# Patient Record
Sex: Male | Born: 1955 | Race: White | Hispanic: No | Marital: Single | Smoking: Never smoker
Health system: Southern US, Community
[De-identification: ages and names within clinical notes are randomized; demographics above are authoritative.]

## PROBLEM LIST (undated history)

## (undated) DIAGNOSIS — IMO0001 Reserved for inherently not codable concepts without codable children: Secondary | ICD-10-CM

## (undated) DIAGNOSIS — E785 Hyperlipidemia, unspecified: Secondary | ICD-10-CM

## (undated) DIAGNOSIS — I10 Essential (primary) hypertension: Secondary | ICD-10-CM

## (undated) DIAGNOSIS — K219 Gastro-esophageal reflux disease without esophagitis: Secondary | ICD-10-CM

## (undated) HISTORY — DX: Hyperlipidemia, unspecified: E78.5

## (undated) HISTORY — DX: Reserved for inherently not codable concepts without codable children: IMO0001

## (undated) HISTORY — PX: NASAL SINUS SURGERY: SHX719

## (undated) HISTORY — DX: Essential (primary) hypertension: I10

## (undated) HISTORY — DX: Gastro-esophageal reflux disease without esophagitis: K21.9

---

## 2002-03-01 ENCOUNTER — Emergency Department (HOSPITAL_COMMUNITY): Admission: EM | Admit: 2002-03-01 | Discharge: 2002-03-01 | Payer: Self-pay | Admitting: Emergency Medicine

## 2002-03-01 ENCOUNTER — Encounter: Payer: Self-pay | Admitting: Emergency Medicine

## 2003-12-14 ENCOUNTER — Ambulatory Visit (HOSPITAL_COMMUNITY): Admission: RE | Admit: 2003-12-14 | Discharge: 2003-12-14 | Payer: Self-pay | Admitting: Family Medicine

## 2004-01-27 ENCOUNTER — Ambulatory Visit (HOSPITAL_COMMUNITY): Admission: RE | Admit: 2004-01-27 | Discharge: 2004-01-27 | Payer: Self-pay | Admitting: Otolaryngology

## 2004-01-27 ENCOUNTER — Ambulatory Visit (HOSPITAL_BASED_OUTPATIENT_CLINIC_OR_DEPARTMENT_OTHER): Admission: RE | Admit: 2004-01-27 | Discharge: 2004-01-27 | Payer: Self-pay | Admitting: Otolaryngology

## 2013-03-30 ENCOUNTER — Ambulatory Visit (INDEPENDENT_AMBULATORY_CARE_PROVIDER_SITE_OTHER): Payer: PRIVATE HEALTH INSURANCE

## 2013-03-30 ENCOUNTER — Encounter: Payer: Self-pay | Admitting: Podiatry

## 2013-03-30 ENCOUNTER — Telehealth: Payer: Self-pay | Admitting: *Deleted

## 2013-03-30 ENCOUNTER — Ambulatory Visit (INDEPENDENT_AMBULATORY_CARE_PROVIDER_SITE_OTHER): Payer: PRIVATE HEALTH INSURANCE | Admitting: Podiatry

## 2013-03-30 VITALS — Ht 76.0 in | Wt 260.0 lb

## 2013-03-30 DIAGNOSIS — Q828 Other specified congenital malformations of skin: Secondary | ICD-10-CM

## 2013-03-30 DIAGNOSIS — M7731 Calcaneal spur, right foot: Secondary | ICD-10-CM

## 2013-03-30 DIAGNOSIS — M79671 Pain in right foot: Secondary | ICD-10-CM

## 2013-03-30 DIAGNOSIS — M79609 Pain in unspecified limb: Secondary | ICD-10-CM

## 2013-03-30 DIAGNOSIS — M7730 Calcaneal spur, unspecified foot: Secondary | ICD-10-CM

## 2013-03-30 DIAGNOSIS — M7660 Achilles tendinitis, unspecified leg: Secondary | ICD-10-CM

## 2013-03-30 MED ORDER — METHYLPREDNISOLONE (PAK) 4 MG PO TABS: ORAL_TABLET | ORAL | Status: DC

## 2013-03-30 NOTE — Progress Notes (Signed)
Mr. Letitia Libra presents today as a 57 year old white male with a chief complaint of pain to his right heel. He states that the pain is been present for several months and the bump is been present for as long as he can remember. He states this started hurting back in March of this year and gradually got worse walking bothers him and particularly going downstairs. 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 He tried ice and ibuprofen. He also has a painful callus to the plantar aspect of the fifth metatarsal left foot he had one similarly to the right foot but has taken care of it himself. He states he is unable to get rid of the one to the left foot. States that the pain in the right heel is more of a throbbing pain with sharp shooting pain.  Objective: Vital signs are stable he is alert and oriented x3. I have reviewed his past medical history is medications and allergies and his review of systems. Pulses are bilateral lower extremity are intact. Neurologic sensorium is intact per Semmes-Weinstein monofilament. Deep tendon reflexes are brisk and symmetrical bilateral. Muscle strength is 5 over 5 dorsiflexors plantar flexors inverters everters all intrinsic musculature is intact orthopedic evaluation demonstrates a large nonpulsatile mass to the posterior aspect of the right heel similarly to the left heel but not tender. Cutaneous evaluation does demonstrates supple well hydrated cutis with exception of her reactive porokeratotic lesion sub-fifth metatarsal head of the left foot which we debrided today. Graphic evaluation does demonstrate a very large mass of bone to the posterior aspect of the calcaneus indicative of a retrocalcaneal heel spur he also has thickening of the tendo Achilles which is indicative of tendo Achilles tendinitis and possibly a tear because of the thickness that projects proximally. Not being able to walk down steps oftentimes is indicative of a  tear.  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 Porokeratosis sub-fifth metatarsal head left foot.  Plan: We discussed the etiology pathology conservative versus surgical therapies for his right foot. At this point I do believe an MRI is necessary to evaluate the structural integrity of the tendo Achilles and this is a surgical consideration. I debrided the reactive hyperkeratosis sub-fifth metatarsal head left foot. I wrote a prescription for Medrol Dosepak which she will start in the near future nor followup with him once his MRI has returned.

## 2013-03-30 NOTE — Progress Notes (Signed)
  Subjective:    Patient ID: Tommy Roberts, male    DOB: [birth year], 57 y.o.   MRN: [id number]  HPI Comments: N sharp , burning, throbbing , knot, swollen  L right back of heel  D as long as i can remember, many years , it started hurting in march of this year  O gradual  C worse  A walk , cant go down steps correctly  T ice , ibuprofen   N callused lesion  L 5ht met left foot  D several years  O gradual  C its about the same  A walking  T  Tried to dig it out   Foot Pain      Review of Systems  HENT:       Sinus problems   All other systems reviewed and are negative.       Objective:   Physical Exam        Assessment & Plan:

## 2013-03-30 NOTE — Telephone Encounter (Signed)
PATIENT CALLED AND LEFT VOICEMAIL WANTING TO KNOW IF IT WAS NECESSARY TO PICK UP THE PRESCRIPTION DR HYATT PRESCRIBED? THAT HE WAS NOT HAVING THE PAIN TODAY.  I CALLED THE PATIENT BACK AND LEFT A MESSAGE FOR HIM STATING THAT YES HE SHOULD START THE MEDICATION THAT DR HYATT PRESCRIBED IT WAS FOR THE INFLAMMATION AND FOR PAIN. LEFT HIM A MESSAGE STATING THAT IF HE HAD ANY OTHER QUESTIONS TO CALL us BACK

## 2013-04-17 ENCOUNTER — Ambulatory Visit: Payer: Self-pay | Admitting: Podiatry

## 2013-04-27 ENCOUNTER — Encounter: Payer: Self-pay | Admitting: Podiatry

## 2013-04-27 ENCOUNTER — Telehealth: Payer: Self-pay | Admitting: Podiatry

## 2013-04-27 NOTE — Telephone Encounter (Signed)
Left message for patient to schedule an appointment for MRI results 

## 2013-05-31 ENCOUNTER — Emergency Department: Payer: Self-pay | Admitting: Emergency Medicine

## 2013-05-31 LAB — CBC
HCT: 46.3 % (ref 40.0–52.0)
HGB: 15.1 g/dL (ref 13.0–18.0)
MCH: 29.4 pg (ref 26.0–34.0)
MCHC: 32.7 g/dL (ref 32.0–36.0)
MCV: 90 fL (ref 80–100)
Platelet: 238 10*3/uL (ref 150–440)
RBC: 5.15 10*6/uL (ref 4.40–5.90)
RDW: 14.2 % (ref 11.5–14.5)
WBC: 11.6 10*3/uL — AB (ref 3.8–10.6)

## 2013-05-31 LAB — URINALYSIS, COMPLETE
Bilirubin,UR: NEGATIVE
Blood: NEGATIVE
Glucose,UR: NEGATIVE mg/dL (ref 0–75)
Ketone: NEGATIVE
Leukocyte Esterase: NEGATIVE
Nitrite: NEGATIVE
Ph: 5 (ref 4.5–8.0)
Protein: NEGATIVE
Specific Gravity: 1.019 (ref 1.003–1.030)
WBC UR: <1 /HPF (ref 0–5)

## 2013-05-31 LAB — COMPREHENSIVE METABOLIC PANEL
ALBUMIN: 3.9 g/dL (ref 3.4–5.0)
ALK PHOS: 118 U/L — AB
ALT: 20 U/L (ref 12–78)
AST: 23 U/L (ref 15–37)
Anion Gap: 5 — ABNORMAL LOW (ref 7–16)
BILIRUBIN TOTAL: 0.3 mg/dL (ref 0.2–1.0)
BUN: 16 mg/dL (ref 7–18)
Calcium, Total: 9.3 mg/dL (ref 8.5–10.1)
Chloride: 105 mmol/L (ref 98–107)
Co2: 26 mmol/L (ref 21–32)
Creatinine: 1.11 mg/dL (ref 0.60–1.30)
EGFR (African American): >60
EGFR (Non-African Amer.): >60
Glucose: 112 mg/dL — ABNORMAL HIGH (ref 65–99)
Osmolality: 274 (ref 275–301)
Potassium: 3.6 mmol/L (ref 3.5–5.1)
Sodium: 136 mmol/L (ref 136–145)
TOTAL PROTEIN: 7.5 g/dL (ref 6.4–8.2)

## 2013-05-31 LAB — TROPONIN I: Troponin-I: <0.02 ng/mL

## 2015-05-06 ENCOUNTER — Other Ambulatory Visit (HOSPITAL_COMMUNITY): Payer: Self-pay | Admitting: Family Medicine

## 2015-05-06 ENCOUNTER — Ambulatory Visit (HOSPITAL_COMMUNITY)
Admission: RE | Admit: 2015-05-06 | Discharge: 2015-05-06 | Disposition: A | Discharge status: Home / Self Care | Payer: PRIVATE HEALTH INSURANCE | Source: Ambulatory Visit | Attending: Family Medicine | Admitting: Family Medicine

## 2015-05-06 DIAGNOSIS — S0990XA Unspecified injury of head, initial encounter: Secondary | ICD-10-CM | POA: Insufficient documentation
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
## 2018-09-17 ENCOUNTER — Other Ambulatory Visit: Payer: Self-pay | Admitting: Family Medicine

## 2018-09-17 DIAGNOSIS — I714 Abdominal aortic aneurysm, without rupture, unspecified: Secondary | ICD-10-CM

## 2021-02-08 DIAGNOSIS — Z23 Encounter for immunization: Secondary | ICD-10-CM | POA: Diagnosis not present

## 2021-08-17 DIAGNOSIS — I10 Essential (primary) hypertension: Secondary | ICD-10-CM | POA: Diagnosis not present

## 2021-08-17 DIAGNOSIS — R369 Urethral discharge, unspecified: Secondary | ICD-10-CM | POA: Diagnosis not present

## 2021-10-17 DIAGNOSIS — E782 Mixed hyperlipidemia: Secondary | ICD-10-CM | POA: Diagnosis not present

## 2021-10-17 DIAGNOSIS — I10 Essential (primary) hypertension: Secondary | ICD-10-CM | POA: Diagnosis not present

## 2021-11-16 ENCOUNTER — Other Ambulatory Visit: Payer: Self-pay | Admitting: Family Medicine

## 2021-11-16 DIAGNOSIS — I790 Aneurysm of aorta in diseases classified elsewhere: Secondary | ICD-10-CM

## 2021-11-27 ENCOUNTER — Ambulatory Visit
Admission: RE | Admit: 2021-11-27 | Discharge: 2021-11-27 | Disposition: A | Discharge status: Home / Self Care | Payer: Medicare Other | Source: Ambulatory Visit | Attending: Family Medicine | Admitting: Family Medicine

## 2021-11-27 DIAGNOSIS — I714 Abdominal aortic aneurysm, without rupture, unspecified: Secondary | ICD-10-CM | POA: Diagnosis not present

## 2021-11-27 DIAGNOSIS — I790 Aneurysm of aorta in diseases classified elsewhere: Secondary | ICD-10-CM

## 2021-12-12 DIAGNOSIS — E782 Mixed hyperlipidemia: Secondary | ICD-10-CM | POA: Diagnosis not present

## 2021-12-12 DIAGNOSIS — T43225D Adverse effect of selective serotonin reuptake inhibitors, subsequent encounter: Secondary | ICD-10-CM | POA: Diagnosis not present

## 2021-12-12 DIAGNOSIS — I790 Aneurysm of aorta in diseases classified elsewhere: Secondary | ICD-10-CM | POA: Diagnosis not present

## 2021-12-12 DIAGNOSIS — I10 Essential (primary) hypertension: Secondary | ICD-10-CM | POA: Diagnosis not present

## 2022-02-12 ENCOUNTER — Ambulatory Visit (LOCAL_COMMUNITY_HEALTH_CENTER): Payer: Medicare Other

## 2022-02-12 DIAGNOSIS — Z719 Counseling, unspecified: Secondary | ICD-10-CM

## 2022-02-12 DIAGNOSIS — Z23 Encounter for immunization: Secondary | ICD-10-CM

## 2022-02-12 NOTE — Progress Notes (Signed)
  Are you feeling sick today? No   Have you ever received a dose of COVID-19 Vaccine? [employer], [place], [place], [place], Other) Yes  If yes, which vaccine and how many doses?    Pfizer 4 doses  Did you bring the vaccination record card or other documentation?  No   Do you have a health condition or are undergoing treatment that makes you moderately or severely immunocompromised? This would include, but not be limited to: cancer, HIV, organ transplant, immunosuppressive therapy/high-dose corticosteroids, or moderate/severe primary immunodeficiency.  No  Have you received COVID-19 vaccine before or during hematopoietic cell transplant (HCT) or CAR-T-cell therapies? No  Have you ever had an allergic reaction to: (This would include a severe allergic reaction or a reaction that caused hives, swelling, or respiratory distress, including wheezing.) A component of a COVID-19 vaccine or a previous dose of COVID-19 vaccine? No   Have you ever had an allergic reaction to another vaccine (other thanCOVID-19 vaccine) or an injectable medication? (This would include a severe allergic reaction or a reaction that caused hives, swelling, or respiratory distress, including wheezing.)   No    Do you have a history of any of the following:  Myocarditis or Pericarditis No  Dermal fillers:  No  Multisystem Inflammatory Syndrome (MIS-C or MIS-A)? No  COVID-19 disease within the past 3 months? No  Vaccinated with monkeypox vaccine in the last 4 weeks? No  Administered Comirnaty 2023-24 12+ covid vaccine and Fluzone high dose flu vaccine; tolerated well.  VISs given.  [facility] updated and copy to pt.  Remained 15 minutes after vaccine given.    Tonny Branch, RN

## 2022-09-12 DIAGNOSIS — I700 Atherosclerosis of aorta: Secondary | ICD-10-CM | POA: Diagnosis not present

## 2022-09-12 DIAGNOSIS — T43225D Adverse effect of selective serotonin reuptake inhibitors, subsequent encounter: Secondary | ICD-10-CM | POA: Diagnosis not present

## 2022-09-12 DIAGNOSIS — Z23 Encounter for immunization: Secondary | ICD-10-CM | POA: Diagnosis not present

## 2022-09-12 DIAGNOSIS — Z0000 Encounter for general adult medical examination without abnormal findings: Secondary | ICD-10-CM | POA: Diagnosis not present

## 2022-09-12 DIAGNOSIS — I10 Essential (primary) hypertension: Secondary | ICD-10-CM | POA: Diagnosis not present

## 2022-09-12 DIAGNOSIS — R7301 Impaired fasting glucose: Secondary | ICD-10-CM | POA: Diagnosis not present

## 2022-09-12 DIAGNOSIS — E782 Mixed hyperlipidemia: Secondary | ICD-10-CM | POA: Diagnosis not present

## 2022-09-19 DIAGNOSIS — E782 Mixed hyperlipidemia: Secondary | ICD-10-CM | POA: Diagnosis not present

## 2023-05-10 DIAGNOSIS — E7800 Pure hypercholesterolemia, unspecified: Secondary | ICD-10-CM | POA: Diagnosis not present

## 2023-05-13 DIAGNOSIS — M65352 Trigger finger, left little finger: Secondary | ICD-10-CM | POA: Diagnosis not present

## 2023-05-13 DIAGNOSIS — I10 Essential (primary) hypertension: Secondary | ICD-10-CM | POA: Diagnosis not present

## 2023-08-12 DIAGNOSIS — L03211 Cellulitis of face: Secondary | ICD-10-CM | POA: Diagnosis not present

## 2023-08-12 DIAGNOSIS — I10 Essential (primary) hypertension: Secondary | ICD-10-CM | POA: Diagnosis not present

## 2023-08-12 DIAGNOSIS — L237 Allergic contact dermatitis due to plants, except food: Secondary | ICD-10-CM | POA: Diagnosis not present

## 2023-08-15 DIAGNOSIS — L237 Allergic contact dermatitis due to plants, except food: Secondary | ICD-10-CM | POA: Diagnosis not present

## 2023-08-15 DIAGNOSIS — L03211 Cellulitis of face: Secondary | ICD-10-CM | POA: Diagnosis not present

## 2023-12-13 DIAGNOSIS — I7143 Infrarenal abdominal aortic aneurysm, without rupture: Secondary | ICD-10-CM | POA: Diagnosis not present

## 2023-12-13 DIAGNOSIS — I714 Abdominal aortic aneurysm, without rupture, unspecified: Secondary | ICD-10-CM | POA: Diagnosis not present

## 2023-12-13 DIAGNOSIS — I723 Aneurysm of iliac artery: Secondary | ICD-10-CM | POA: Diagnosis not present

## 2024-02-06 DIAGNOSIS — E7800 Pure hypercholesterolemia, unspecified: Secondary | ICD-10-CM | POA: Diagnosis not present

## 2024-02-06 DIAGNOSIS — R7303 Prediabetes: Secondary | ICD-10-CM | POA: Diagnosis not present

## 2024-02-06 DIAGNOSIS — R5383 Other fatigue: Secondary | ICD-10-CM | POA: Diagnosis not present

## 2024-03-10 ENCOUNTER — Inpatient Hospital Stay
Admission: RE | Admit: 2024-03-10 | Discharge: 2024-03-10 | Disposition: A | Discharge status: Home / Self Care | Payer: Self-pay | Source: Ambulatory Visit | Attending: Vascular Surgery | Admitting: Vascular Surgery

## 2024-03-10 ENCOUNTER — Other Ambulatory Visit (HOSPITAL_COMMUNITY): Payer: Self-pay | Admitting: Vascular Surgery

## 2024-03-10 DIAGNOSIS — I714 Abdominal aortic aneurysm, without rupture, unspecified: Secondary | ICD-10-CM

## 2024-03-18 ENCOUNTER — Encounter: Payer: Self-pay | Admitting: Vascular Surgery

## 2024-03-18 ENCOUNTER — Ambulatory Visit: Attending: Vascular Surgery | Admitting: Vascular Surgery

## 2024-03-18 VITALS — BP 122/84 | HR 77 | Temp 98.0°F | Ht 76.0 in | Wt 279.0 lb

## 2024-03-18 DIAGNOSIS — I7143 Infrarenal abdominal aortic aneurysm, without rupture: Secondary | ICD-10-CM

## 2024-03-18 NOTE — Progress Notes (Signed)
 Patient ID: Tommy Roberts, male   DOB: [birth year], 68 y.o.   MRN: [id number]  Reason for Consult: New Patient (Initial Visit)   Referred by Seabron Lenis, MD  Subjective:     HPI:  Tommy Roberts is a 68 y.o. male without significant vascular history.  He does have a history of exposure to smoking however he was not a smoker both his mother and stepfather smoked in the health for many years.  He does not have any family history of aneurysm disease was found to have an aneurysm on routine ultrasound.  He does not have any new back or abdominal pain.  His family is of [place] descent.  Past Medical History:  Diagnosis Date   Hyperlipidemia    Hypertension    Reflux    History reviewed. No pertinent family history. Past Surgical History:  Procedure Laterality Date   NASAL SINUS SURGERY      Short Social History:  Social History   Tobacco Use   Smoking status: Never   Smokeless tobacco: Never  Substance Use Topics   Alcohol use: Yes    Comment: occasional     Allergies  Allergen Reactions   Azithromycin Nausea And Vomiting and Nausea Only    Current Outpatient Medications  Medication Sig Dispense Refill   buPROPion (WELLBUTRIN XL) 150 MG 24 hr tablet Take 150 mg by mouth daily.     clonazePAM (KLONOPIN) 0.5 MG tablet Take 0.5 mg by mouth as needed.     Fexofenadine-Pseudoephedrine (ALLEGRA-D 12 HOUR PO) Take by mouth.     ibuprofen (ADVIL,MOTRIN) 200 MG tablet Take 200 mg by mouth every 6 (six) hours as needed.     metoprolol succinate (TOPROL-XL) 50 MG 24 hr tablet Take 50 mg by mouth daily.     No current facility-administered medications for this visit.    Review of Systems  Constitutional:  Constitutional negative. HENT: HENT negative.  Eyes: Eyes negative.  Respiratory: Respiratory negative.  Cardiovascular: Cardiovascular negative.  GI: Gastrointestinal negative.  Musculoskeletal: Musculoskeletal negative.  Skin: Skin negative.   Neurological: Neurological negative. Hematologic: Hematologic/lymphatic negative.  Psychiatric: Psychiatric negative.        Objective:  Objective   Vitals:   03/18/24 1406  BP: 122/84  Pulse: 77  Temp: 98 F (36.7 C)  SpO2: 96%  Weight: 279 lb (126.6 kg)  Height: 6' 4 (1.93 m)   Body mass index is 33.96 kg/m.  Physical Exam HENT:     Head: Normocephalic.     Nose: Nose normal.  Eyes:     Pupils: Pupils are equal, round, and reactive to light.  Cardiovascular:     Pulses: Normal pulses.     Comments: Popliteal pulses do not feel enlarged Pulmonary:     Effort: Pulmonary effort is normal.  Abdominal:     General: Abdomen is flat.  Musculoskeletal:        General: Normal range of motion.     Cervical back: Normal range of motion.     Right lower leg: No edema.  Skin:    General: Skin is warm.     Capillary Refill: Capillary refill takes less than 2 seconds.  Neurological:     General: No focal deficit present.     Mental Status: He is alert.  Psychiatric:        Mood and Affect: Mood normal.     Data: Duplex aorta 12/15/2023 demonstrates 4.3 cm distal aorta and sagittal view.  Assessment/Plan:     68 year old male with history of 4.3 cm abdominal aortic aneurysm from ultrasound and August of this year.  We discussed signs and symptoms of rupture as well as expected course of aneurysm growth.  Will plan to follow-up in 8 to 9 months with repeat abdominal aortic duplex at that time will be 1 year from last ultrasound.  All of his questions were answered he demonstrates good understanding.     Penne Lonni Colorado MD [facility]
